# Patient Record
Sex: Female | Born: 1945 | Race: White | Hispanic: No | Smoking: Never smoker
Health system: Southern US, Community
[De-identification: ages and names within clinical notes are randomized; demographics above are authoritative.]

## PROBLEM LIST (undated history)

## (undated) DIAGNOSIS — K5090 Crohn's disease, unspecified, without complications: Secondary | ICD-10-CM

## (undated) DIAGNOSIS — K859 Acute pancreatitis without necrosis or infection, unspecified: Secondary | ICD-10-CM

## (undated) DIAGNOSIS — M1990 Unspecified osteoarthritis, unspecified site: Secondary | ICD-10-CM

## (undated) DIAGNOSIS — E119 Type 2 diabetes mellitus without complications: Secondary | ICD-10-CM

## (undated) DIAGNOSIS — I10 Essential (primary) hypertension: Secondary | ICD-10-CM

## (undated) HISTORY — DX: Type 2 diabetes mellitus without complications: E11.9

## (undated) HISTORY — PX: TYMPANOSTOMY TUBE PLACEMENT: SHX32

## (undated) HISTORY — PX: COLON SURGERY: SHX602

## (undated) HISTORY — PX: ABDOMINAL HYSTERECTOMY: SHX81

## (undated) HISTORY — PX: BACK SURGERY: SHX140

---

## 2008-02-09 ENCOUNTER — Emergency Department (HOSPITAL_COMMUNITY): Admission: EM | Admit: 2008-02-09 | Discharge: 2008-02-09 | Payer: Self-pay | Admitting: Emergency Medicine

## 2009-10-13 ENCOUNTER — Inpatient Hospital Stay (HOSPITAL_COMMUNITY): Admission: EM | Admit: 2009-10-13 | Discharge: 2009-10-19 | Payer: Self-pay | Admitting: Emergency Medicine

## 2010-05-22 LAB — CBC
HCT: 34.3 % — ABNORMAL LOW (ref 36.0–46.0)
HCT: 37.9 % (ref 36.0–46.0)
Hemoglobin: 11.1 g/dL — ABNORMAL LOW (ref 12.0–15.0)
Hemoglobin: 12.1 g/dL (ref 12.0–15.0)
Hemoglobin: 12.9 g/dL (ref 12.0–15.0)
MCH: 30.1 pg (ref 26.0–34.0)
MCHC: 34 g/dL (ref 30.0–36.0)
MCV: 92 fL (ref 78.0–100.0)
MCV: 93 fL (ref 78.0–100.0)
Platelets: 162 10*3/uL (ref 150–400)
Platelets: 163 10*3/uL (ref 150–400)
RBC: 3.69 MIL/uL — ABNORMAL LOW (ref 3.87–5.11)
RBC: 4.12 MIL/uL (ref 3.87–5.11)
WBC: 4.7 10*3/uL (ref 4.0–10.5)
WBC: 6.3 10*3/uL (ref 4.0–10.5)

## 2010-05-22 LAB — COMPREHENSIVE METABOLIC PANEL
Albumin: 4 g/dL (ref 3.5–5.2)
Alkaline Phosphatase: 67 U/L (ref 39–117)
BUN: 13 mg/dL (ref 6–23)
BUN: 3 mg/dL — ABNORMAL LOW (ref 6–23)
CO2: 19 mEq/L (ref 19–32)
Chloride: 107 mEq/L (ref 96–112)
Creatinine, Ser: 1.04 mg/dL (ref 0.4–1.2)
GFR calc non Af Amer: 53 mL/min — ABNORMAL LOW (ref >60)
Glucose, Bld: 185 mg/dL — ABNORMAL HIGH (ref 70–99)
Potassium: 3.7 mEq/L (ref 3.5–5.1)
Total Bilirubin: 1.2 mg/dL (ref 0.3–1.2)

## 2010-05-22 LAB — URINALYSIS, ROUTINE W REFLEX MICROSCOPIC
Bilirubin Urine: NEGATIVE
Glucose, UA: NEGATIVE mg/dL
Ketones, ur: NEGATIVE mg/dL
pH: 5 (ref 5.0–8.0)

## 2010-05-22 LAB — URINE MICROSCOPIC-ADD ON

## 2010-05-22 LAB — CLOSTRIDIUM DIFFICILE EIA: C difficile Toxins A+B, EIA: NEGATIVE

## 2010-05-22 LAB — GIARDIA/CRYPTOSPORIDIUM SCREEN(EIA)
Cryptosporidium Screen (EIA): NEGATIVE
Giardia Screen - EIA: NEGATIVE

## 2010-05-22 LAB — FECAL LACTOFERRIN, QUANT: Fecal Lactoferrin: POSITIVE

## 2010-05-22 LAB — DIFFERENTIAL
Basophils Absolute: 0 10*3/uL (ref 0.0–0.1)
Basophils Relative: 0 % (ref 0–1)
Basophils Relative: 0 % (ref 0–1)
Eosinophils Absolute: 0.1 10*3/uL (ref 0.0–0.7)
Eosinophils Relative: 1 % (ref 0–5)
Eosinophils Relative: 3 % (ref 0–5)
Lymphocytes Relative: 24 % (ref 12–46)
Lymphocytes Relative: 33 % (ref 12–46)
Lymphs Abs: 1.5 10*3/uL (ref 0.7–4.0)
Monocytes Relative: 7 % (ref 3–12)
Monocytes Relative: 9 % (ref 3–12)
Neutro Abs: 5.7 10*3/uL (ref 1.7–7.7)
Neutrophils Relative %: 55 % (ref 43–77)
Neutrophils Relative %: 67 % (ref 43–77)

## 2010-05-22 LAB — BASIC METABOLIC PANEL
BUN: 5 mg/dL — ABNORMAL LOW (ref 6–23)
BUN: 5 mg/dL — ABNORMAL LOW (ref 6–23)
CO2: 20 mEq/L (ref 19–32)
CO2: 22 mEq/L (ref 19–32)
CO2: 25 mEq/L (ref 19–32)
Calcium: 8.6 mg/dL (ref 8.4–10.5)
Calcium: 8.7 mg/dL (ref 8.4–10.5)
Chloride: 106 mEq/L (ref 96–112)
Creatinine, Ser: 0.84 mg/dL (ref 0.4–1.2)
GFR calc Af Amer: >60 mL/min (ref >60)
GFR calc Af Amer: >60 mL/min (ref >60)
GFR calc non Af Amer: >60 mL/min (ref >60)
Glucose, Bld: 108 mg/dL — ABNORMAL HIGH (ref 70–99)
Glucose, Bld: 139 mg/dL — ABNORMAL HIGH (ref 70–99)
Glucose, Bld: 166 mg/dL — ABNORMAL HIGH (ref 70–99)
Potassium: 3.2 mEq/L — ABNORMAL LOW (ref 3.5–5.1)
Potassium: 4.2 mEq/L (ref 3.5–5.1)
Sodium: 141 mEq/L (ref 135–145)
Sodium: 141 mEq/L (ref 135–145)
Sodium: 142 mEq/L (ref 135–145)

## 2010-05-22 LAB — STOOL CULTURE

## 2010-05-22 LAB — LIPASE, BLOOD: Lipase: 37 U/L (ref 11–59)

## 2010-12-11 LAB — URINE MICROSCOPIC-ADD ON

## 2010-12-11 LAB — URINALYSIS, ROUTINE W REFLEX MICROSCOPIC
Glucose, UA: NEGATIVE mg/dL
pH: 5.5 (ref 5.0–8.0)

## 2010-12-11 LAB — COMPREHENSIVE METABOLIC PANEL
Albumin: 4 g/dL (ref 3.5–5.2)
Alkaline Phosphatase: 69 U/L (ref 39–117)
BUN: 14 mg/dL (ref 6–23)
Calcium: 9.6 mg/dL (ref 8.4–10.5)
Potassium: 4 mEq/L (ref 3.5–5.1)
Sodium: 142 mEq/L (ref 135–145)
Total Protein: 7.5 g/dL (ref 6.0–8.3)

## 2010-12-11 LAB — CBC
HCT: 43.5 % (ref 36.0–46.0)
MCHC: 33.5 g/dL (ref 30.0–36.0)
Platelets: 317 10*3/uL (ref 150–400)
RDW: 12.9 % (ref 11.5–15.5)

## 2010-12-11 LAB — DIFFERENTIAL
Basophils Relative: 1 % (ref 0–1)
Lymphs Abs: 1.9 10*3/uL (ref 0.7–4.0)
Monocytes Absolute: 0.6 10*3/uL (ref 0.1–1.0)
Monocytes Relative: 8 % (ref 3–12)
Neutro Abs: 5.4 10*3/uL (ref 1.7–7.7)

## 2014-02-25 ENCOUNTER — Emergency Department (HOSPITAL_COMMUNITY): Payer: Medicare FFS

## 2014-02-25 ENCOUNTER — Encounter (HOSPITAL_COMMUNITY): Payer: Self-pay | Admitting: Emergency Medicine

## 2014-02-25 ENCOUNTER — Emergency Department (HOSPITAL_COMMUNITY)
Admission: EM | Admit: 2014-02-25 | Discharge: 2014-02-25 | Disposition: A | Discharge status: Home / Self Care | Payer: Medicare FFS | Attending: Emergency Medicine | Admitting: Emergency Medicine

## 2014-02-25 DIAGNOSIS — M1990 Unspecified osteoarthritis, unspecified site: Secondary | ICD-10-CM | POA: Insufficient documentation

## 2014-02-25 DIAGNOSIS — Z794 Long term (current) use of insulin: Secondary | ICD-10-CM | POA: Insufficient documentation

## 2014-02-25 DIAGNOSIS — Z791 Long term (current) use of non-steroidal anti-inflammatories (NSAID): Secondary | ICD-10-CM | POA: Diagnosis not present

## 2014-02-25 DIAGNOSIS — E119 Type 2 diabetes mellitus without complications: Secondary | ICD-10-CM | POA: Insufficient documentation

## 2014-02-25 DIAGNOSIS — S39012A Strain of muscle, fascia and tendon of lower back, initial encounter: Secondary | ICD-10-CM | POA: Diagnosis not present

## 2014-02-25 DIAGNOSIS — Z8719 Personal history of other diseases of the digestive system: Secondary | ICD-10-CM | POA: Insufficient documentation

## 2014-02-25 DIAGNOSIS — Z79899 Other long term (current) drug therapy: Secondary | ICD-10-CM | POA: Diagnosis not present

## 2014-02-25 DIAGNOSIS — W06XXXA Fall from bed, initial encounter: Secondary | ICD-10-CM | POA: Diagnosis not present

## 2014-02-25 DIAGNOSIS — Z7901 Long term (current) use of anticoagulants: Secondary | ICD-10-CM | POA: Insufficient documentation

## 2014-02-25 DIAGNOSIS — Y929 Unspecified place or not applicable: Secondary | ICD-10-CM | POA: Insufficient documentation

## 2014-02-25 DIAGNOSIS — Z880 Allergy status to penicillin: Secondary | ICD-10-CM | POA: Insufficient documentation

## 2014-02-25 DIAGNOSIS — S3992XA Unspecified injury of lower back, initial encounter: Secondary | ICD-10-CM | POA: Diagnosis present

## 2014-02-25 DIAGNOSIS — S4991XA Unspecified injury of right shoulder and upper arm, initial encounter: Secondary | ICD-10-CM | POA: Insufficient documentation

## 2014-02-25 DIAGNOSIS — Y9389 Activity, other specified: Secondary | ICD-10-CM | POA: Diagnosis not present

## 2014-02-25 DIAGNOSIS — I10 Essential (primary) hypertension: Secondary | ICD-10-CM | POA: Diagnosis not present

## 2014-02-25 DIAGNOSIS — M549 Dorsalgia, unspecified: Secondary | ICD-10-CM

## 2014-02-25 DIAGNOSIS — Y998 Other external cause status: Secondary | ICD-10-CM | POA: Insufficient documentation

## 2014-02-25 DIAGNOSIS — W19XXXA Unspecified fall, initial encounter: Secondary | ICD-10-CM

## 2014-02-25 HISTORY — DX: Unspecified osteoarthritis, unspecified site: M19.90

## 2014-02-25 HISTORY — DX: Essential (primary) hypertension: I10

## 2014-02-25 HISTORY — DX: Acute pancreatitis without necrosis or infection, unspecified: K85.90

## 2014-02-25 HISTORY — DX: Type 2 diabetes mellitus without complications: E11.9

## 2014-02-25 HISTORY — DX: Crohn's disease, unspecified, without complications: K50.90

## 2014-02-25 NOTE — ED Notes (Signed)
Fell yesterday at 0730 am.  Pt uses walker, say walker went one way and she fell towards the dresser.  Hit both knees, right arm, buttocks and right shoulder.  Rate all areas 7-8.  Back only hurts when moving.  Took 2 Norco yesterday with mild relief.

## 2014-02-25 NOTE — ED Provider Notes (Signed)
CSN: [id number]     Arrival date & time 02/25/14  1135 History   First MD Initiated Contact with Patient 02/25/14 1246     Chief Complaint  Patient presents with  . Fall     (Consider location/radiation/quality/duration/timing/severity/associated sxs/prior Treatment) Patient is a 68 y.o. female presenting with fall. The history is provided by the patient and a relative.  Fall Pertinent negatives include no chest pain, no abdominal pain, no headaches and no shortness of breath.   patient status post fall yesterday while getting out of bed. Tripped over her walker. Patient with complaint of bilateral knee pain and right shoulder pain. As well as low back pain. Fall was yesterday morning. Patient has hydrocodone at home and she took that with some relief. Patient status post multiple back surgeries many years ago. No loss of consciousness. No chest pain no shortness of breath no abdominal pain. No neck pain. No head pain.  Past Medical History  Diagnosis Date  . Hypertension   . Crohn's disease   . Diabetes mellitus without complication   . Pancreatitis   . Arthritis    Past Surgical History  Procedure Laterality Date  . Back surgery    . Abdominal hysterectomy    . Tympanostomy tube placement     History reviewed. No pertinent family history. History  Substance Use Topics  . Smoking status: Never Smoker   . Smokeless tobacco: Not on file  . Alcohol Use: No   OB History    No data available     Review of Systems  Constitutional: Negative for fever.  HENT: Negative for congestion.   Eyes: Negative for visual disturbance.  Respiratory: Negative for shortness of breath.   Cardiovascular: Negative for chest pain.  Gastrointestinal: Negative for nausea and abdominal pain.  Genitourinary: Negative for dysuria and hematuria.  Musculoskeletal: Positive for back pain. Negative for neck pain.  Skin: Negative for rash and wound.  Neurological: Negative for headaches.   Hematological: Does not bruise/bleed easily.  Psychiatric/Behavioral: Negative for confusion.      Allergies  Codeine; Contrast media; Iodine; Oxycodone; Penicillins; and Sulfa antibiotics  Home Medications   Prior to Admission medications   Medication Sig Start Date End Date Taking? Authorizing Provider  diclofenac sodium (VOLTAREN) 1 % GEL Apply 2 g topically 4 (four) times daily.   Yes Historical Provider, MD  DULoxetine (CYMBALTA) 20 MG capsule Take 20 mg by mouth daily.   Yes Historical Provider, MD  gabapentin (NEURONTIN) 300 MG capsule Take 300 mg by mouth 3 (three) times daily.   Yes Historical Provider, MD  HYDROcodone-acetaminophen (NORCO) 10-325 MG per tablet Take 1 tablet by mouth daily as needed (pain).   Yes Historical Provider, MD  insulin detemir (LEVEMIR) 100 UNIT/ML injection Inject 16 Units into the skin at bedtime.   Yes Historical Provider, MD  insulin lispro (HUMALOG KWIKPEN) 100 UNIT/ML KiwkPen Inject 1-5 Units into the skin 3 (three) times daily. Sliding scale as follows: 150-199=1 unit 200-249=2 unit 250-299=3 unit 300-349=4 unit If greater than 349 give 5 units and call MD   Yes Historical Provider, MD  mercaptopurine (PURINETHOL) 50 MG tablet Take 50 mg by mouth daily. Give on an empty stomach 1 hour before or 2 hours after meals. Caution: Chemotherapy.   Yes Historical Provider, MD  pantoprazole (PROTONIX) 40 MG tablet Take 40 mg by mouth daily.   Yes Historical Provider, MD  pravastatin (PRAVACHOL) 40 MG tablet Take 40 mg by mouth daily.   Yes Historical  Provider, MD  promethazine (PHENERGAN) 25 MG tablet Take 25 mg by mouth 2 (two) times daily as needed for nausea or vomiting.   Yes Historical Provider, MD  rivaroxaban (XARELTO) 20 MG TABS tablet Take 20 mg by mouth daily with supper.   Yes Historical Provider, MD  rOPINIRole (REQUIP) 2 MG tablet Take 4 mg by mouth at bedtime.   Yes Historical Provider, MD  TRAVATAN Z 0.004 % SOLN ophthalmic solution  Place 1 drop into both eyes at bedtime. 01/08/14  Yes Historical Provider, MD   BP 156/73 mmHg  Pulse 89  Temp(Src) 97.9 F (36.6 C) (Oral)  Ht 5' (1.524 m)  Wt 132 lb (59.875 kg)  BMI 25.78 kg/m2  SpO2 98% Physical Exam  Constitutional: She is oriented to person, place, and time. She appears well-developed and well-nourished. No distress.  HENT:  Head: Normocephalic and atraumatic.  Mouth/Throat: Oropharynx is clear and moist.  Eyes: Conjunctivae and EOM are normal. Pupils are equal, round, and reactive to light.  Neck: Normal range of motion.  Cardiovascular: Normal rate, regular rhythm and normal heart sounds.   Pulmonary/Chest: Effort normal and breath sounds normal. No respiratory distress.  Abdominal: Soft. Bowel sounds are normal. There is no tenderness.  Musculoskeletal: Normal range of motion.  Some pain to range of motion of the right shoulder. No dislocation. No tenderness to elbow no tenderness to wrist on the right radial pulses 1+. Sensation is intact. Both knees without any evidence of swelling or effusion or dislocated patella good range of motion no ligamental instability. Patient with large scars to the lumbar part of her back well healed. Some tenderness to palpation. Some bruising to the right arm.  Neurological: She is alert and oriented to person, place, and time. No cranial nerve deficit. She exhibits normal muscle tone. Coordination normal.  Skin: Skin is warm. No rash noted.  Nursing note and vitals reviewed.   ED Course  Procedures (including critical care time) Labs Review Labs Reviewed - No data to display  Imaging Review Dg Shoulder Right  02/25/2014   CLINICAL DATA:  Lost balance after getting out of bed, tried to catch herself with her RIGHT hand jamming her RIGHT shoulder, posterior RIGHT shoulder pain  EXAM: RIGHT SHOULDER - 2+ VIEW  COMPARISON:  None  FINDINGS: Osseous demineralization.  AC joint alignment normal.  No acute fracture, dislocation or  bone destruction.  Old healed fracture posterior RIGHT fifth rib.  IMPRESSION: No acute abnormalities.   Electronically Signed   By: Ulyses Southward M.D.   On: 02/25/2014 12:16   Ct Lumbar Spine Wo Contrast  02/25/2014   CLINICAL DATA:  Fall getting out of bed yesterday. Low back pain. Prior back surgeries within the past year.  EXAM: CT LUMBAR SPINE WITHOUT CONTRAST  TECHNIQUE: Multidetector CT imaging of the lumbar spine was performed without intravenous contrast administration. Multiplanar CT image reconstructions were also generated.  COMPARISON:  10/14/2009; 10/12/2009  FINDINGS: Posterolateral rod and pedicle screw fixation at L1-L2-L3-L5-S 1 on the right and at L1-L2-L3-L4-L5-S 1 on the left, with interbody cages at L5-S1 and interbody graft material at L3-4 and L4-5.  35% superior endplate compression fracture observed at L1 with lucency around the left pedicle screw, with the right pedicle screw capturing the superior cortex, but without significant bony retropulsion. The pedicle screws appear variably threaded. Posterior decompression observed from L3 through S1. There is bony demineralization with indistinct facet joints at L3-4, especially on the right or the bony facets of  L3 and L4 are not visible and the L4 spinous process is also absent. Strictly speaking I cannot exclude a underlying lytic bony lesion in this vicinity although the appearance might be explained through facetectomy. The S1 posterolateral rods attached to sacral rods which attached to screws in the iliac bones.  IVC filter noted.  Aortoiliac atherosclerotic vascular disease.  Additional findings at individual levels are as follows:  L1-2:  Borderline left foraminal stenosis due to facet arthropathy.  L2-3:  No definite impingement.  L3-4:  No definite impingement.  L4-5:  No definite impingement.  L5-S1:  No bony impingement.  IMPRESSION: 1. Extensive postoperative findings in the lumbar spine. 2. Lucency around the left L1 pedicle  screw which can be seen in the setting of loosening or infection. 3. Chronic superior endplate compression fracture at L1, 35% loss of height. 4. Highly indistinct facet joints at L3-4, especially on the right. This may be from partial facetectomy although strictly speaking a lytic process along the right facet region is not readily excluded. Correlate with operative history. 5. No definite lumbar impingement, although levels are somewhat obscured by streak artifact from the metal implants.   Electronically Signed   By: Herbie Baltimore M.D.   On: 02/25/2014 15:24   Dg Knee Complete 4 Views Left  02/25/2014   CLINICAL DATA:  Right shoulder pain, bilateral knee pain  EXAM: LEFT KNEE - COMPLETE 4+ VIEW; RIGHT KNEE - COMPLETE 4+ VIEW  COMPARISON:  None.  FINDINGS: Right knee: No fracture or dislocation. Generalized osteopenia. No joint effusion. No lytic or sclerotic osseous lesion. Normal soft tissues.  Left knee: No fracture or dislocation. Generalized osteopenia. No joint effusion. No lytic or sclerotic osseous lesion. Normal soft tissues.  IMPRESSION: No acute osseous injury of bilateral knees.   Electronically Signed   By: Elige Ko   On: 02/25/2014 12:28   Dg Knee Complete 4 Views Right  02/25/2014   CLINICAL DATA:  Right shoulder pain, bilateral knee pain  EXAM: LEFT KNEE - COMPLETE 4+ VIEW; RIGHT KNEE - COMPLETE 4+ VIEW  COMPARISON:  None.  FINDINGS: Right knee: No fracture or dislocation. Generalized osteopenia. No joint effusion. No lytic or sclerotic osseous lesion. Normal soft tissues.  Left knee: No fracture or dislocation. Generalized osteopenia. No joint effusion. No lytic or sclerotic osseous lesion. Normal soft tissues.  IMPRESSION: No acute osseous injury of bilateral knees.   Electronically Signed   By: Elige Ko   On: 02/25/2014 12:28     EKG Interpretation None      MDM   Final diagnoses:  Fall  Back pain  Shoulder injury, right, initial encounter  Lumbar strain, initial  encounter    Patient status post fall yesterday morning. Complaint of pain to both knees right shoulder. And also some low back pain. Patient's had multiple surgeries and has metal due to fusion in low part of her back. No loss of consciousness. Patient was getting out of bed and tripped over her walker. Patient denies any loss of consciousness or dizziness nausea or vomiting.  X-rays of both knees without any bony injuries. No evidence of any significant effusion or concern for ligamental injury to the knees. Right arm right wrist elbow normal. Good radial pulse. Right shoulder with some pain with range of motion but can raise her arm above her head. Possible there could be a rotator cuff strain. X-rays of the right shoulder are negative x-rays of the knees are negative. Patient also had CT  scan of her back for evaluation of that no acute injuries there. Patient stable for discharge home.   Vanetta Mulders, MD 02/25/14 [phone]

## 2014-02-25 NOTE — ED Notes (Signed)
Pt taken to room and reported just got back from xray.

## 2014-02-25 NOTE — ED Notes (Signed)
MD at bedside. 

## 2014-02-25 NOTE — ED Notes (Signed)
Pt reports fell getting out of bed yesterday. Pt reports bilateral knee pain,right arm/shoulder pain, back pain. Pt reports has had extensive back surgery last February. nad noted.pt denies any dizziness,loc.

## 2014-02-25 NOTE — Discharge Instructions (Signed)
Workup without any evidence of any bony injuries x-rays of the knees x-ray of the shoulder and is well CT scan of the back without any acute findings. Would recommend continue pain medicine follow-up with your record doctors. Return for any new or worse symptoms.

## 2014-03-12 ENCOUNTER — Encounter (INDEPENDENT_AMBULATORY_CARE_PROVIDER_SITE_OTHER): Payer: Self-pay | Admitting: *Deleted

## 2014-04-18 ENCOUNTER — Encounter (INDEPENDENT_AMBULATORY_CARE_PROVIDER_SITE_OTHER): Payer: Self-pay | Admitting: *Deleted

## 2014-04-18 ENCOUNTER — Encounter (INDEPENDENT_AMBULATORY_CARE_PROVIDER_SITE_OTHER): Payer: Self-pay | Admitting: Internal Medicine

## 2014-04-18 ENCOUNTER — Telehealth (INDEPENDENT_AMBULATORY_CARE_PROVIDER_SITE_OTHER): Payer: Self-pay | Admitting: Internal Medicine

## 2014-04-18 ENCOUNTER — Ambulatory Visit (INDEPENDENT_AMBULATORY_CARE_PROVIDER_SITE_OTHER): Payer: Medicare FFS | Admitting: Internal Medicine

## 2014-04-18 VITALS — BP 124/78 | HR 72 | Temp 97.5°F | Ht 60.0 in | Wt 140.7 lb

## 2014-04-18 DIAGNOSIS — K5090 Crohn's disease, unspecified, without complications: Secondary | ICD-10-CM

## 2014-04-18 NOTE — Telephone Encounter (Signed)
noted 

## 2014-04-18 NOTE — Progress Notes (Addendum)
Subjective:    Patient ID: Tiffany Lee, female    DOB: November 08, 1945, 69 y.o.   MRN: [id number]  HPI 69 yr old female referred by [facility] (Dr. Janann Colonel. Tiburcio Pea), [place], [place] for Crohn's disease. Dr. Aleene Davidson has retired. 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 She is taking 6-MP  daily. She lost response to Remicade and Tysabri per Dr. Vangie Bicker notes.Could not afford Humira per patient.  She tells me she is set up to have Infusion for her Crohn's [facility]). She has not started. Has had TB test in hospital last year. Will check on Hep B and C. She has a lot of nausea. She occasionally has some cramps.  She empties her a ileostomy bag about 3 times a day. Stools are brown in color. No BRRB. Appetite is okay.  No recent weight loss. Occasionally has lower abdominal cramps.  She was in Nursing Home  last year for back surgery for Rehab. She has had 4 back surgeries.Marland Kitchen Hx of spinal stenosis. 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 (recent in the past year). Pulmonary embolus after 1st back surgery in 2014 and two DVT's in 2015.  She has an IV filter. 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 Colonoscope passed thru the ileostomy to the 90cm area. More distally there was erythema and friability but no actual ulcerations beginning at the 5cm level and lasting for about 10cm. Multiple biopsies taken. More proximally the ileal mucosa was normal.  Finding: Probable distal ileal Crohn's.   12/26/2013 H and H 13.4 and 41.4, Platelet ct 481, ALP 109, ALT 15, AST 17, Direct bilirubin 0.1, Indirect bili 0.6., total bili 0.7  9/16.2015 TPMT test: Normal activity  Review of Systems Past Medical History  Diagnosis Date  . Hypertension   . Crohn's disease   . Diabetes mellitus without complication   . Pancreatitis   . Arthritis   . Diabetes   . Hypertension     Past Surgical History  Procedure Laterality Date  . Back  surgery    . Abdominal hysterectomy    . Tympanostomy tube placement    . Colon surgery      ileostomy in 1994    Allergies  Allergen Reactions  . Codeine Itching    Hives,itching  . Contrast Media [Iodinated Diagnostic Agents]     Hives   . Coumadin [Warfarin Sodium]   . Iodine Itching  . Oxycodone     hallucinations  . Penicillins Hives    Rash as a child. Has had ceftriaxone without difficulty subsequently.   . Sulfa Antibiotics     Hives     Current Outpatient Prescriptions on File Prior to Visit  Medication Sig Dispense Refill  . diclofenac sodium (VOLTAREN) 1 % GEL Apply 2 g topically 4 (four) times daily.    . DULoxetine (CYMBALTA) 20 MG capsule Take 20 mg by mouth daily.    Marland Kitchen gabapentin (NEURONTIN) 300 MG capsule Take 300 mg by mouth 3 (three) times daily.    Marland Kitchen HYDROcodone-acetaminophen (NORCO) 10-325 MG per tablet Take 1 tablet by mouth daily as needed (pain).    . insulin detemir (LEVEMIR) 100 UNIT/ML injection Inject 16 Units into the skin at bedtime.    . insulin lispro (HUMALOG KWIKPEN) 100 UNIT/ML KiwkPen Inject 1-5 Units into the skin 3 (three) times daily. Sliding scale as follows: 150-199=1 unit 200-249=2 unit 250-299=3 unit 300-349=4 unit If greater than 349 give 5 units  and call MD    . mercaptopurine (PURINETHOL) 50 MG tablet Take 50 mg by mouth daily. Give on an empty stomach 1 hour before or 2 hours after meals. Caution: Chemotherapy.    . pravastatin (PRAVACHOL) 40 MG tablet Take 40 mg by mouth daily.    . promethazine (PHENERGAN) 25 MG tablet Take 25 mg by mouth 2 (two) times daily as needed for nausea or vomiting.    . rivaroxaban (XARELTO) 20 MG TABS tablet Take 20 mg by mouth daily with supper.    Marland Kitchen rOPINIRole (REQUIP) 2 MG tablet Take 4 mg by mouth at bedtime.    . TRAVATAN Z 0.004 % SOLN ophthalmic solution Place 1 drop into both eyes at bedtime.     No current facility-administered medications on file prior to visit.        Objective:    Physical Exam   Filed Vitals:   04/18/14 1110  Height: 5' (1.524 m)  Weight: 140 lb 11.2 oz (63.821 kg)   Alert and oriented. Skin warm and dry. Oral mucosa is moist.   . Sclera anicteric, conjunctivae is pink. Thyroid not enlarged. No cervical lymphadenopathy. Lungs clear. Heart regular rate and rhythm.  Abdomen is soft. Bowel sounds are positive. No hepatomegaly. No abdominal masses felt. No tenderness.  No edema to lower extremities.          Assessment & Plan:  Small bowel Crohn's disease.  She has an ileostomy in place.  Will get a CBC, CRP, and CMET today.  Keep appt with infusion clinic in [place].  Continue the .  Will call Dr. Vangie Bicker office and get results of TB test, Hep B and C an TPMT level. I have asked Lupita Leash to call office for these.

## 2014-04-18 NOTE — Patient Instructions (Signed)
OV in 6 months. Continue . Keep appt with [facility] for IV infusion in [place], [place].

## 2014-04-19 LAB — COMPREHENSIVE METABOLIC PANEL
ALT: 21 U/L (ref 0–35)
AST: 19 U/L (ref 0–37)
Albumin: 4.1 g/dL (ref 3.5–5.2)
Alkaline Phosphatase: 122 U/L — ABNORMAL HIGH (ref 39–117)
BUN: 18 mg/dL (ref 6–23)
CALCIUM: 9.7 mg/dL (ref 8.4–10.5)
CHLORIDE: 109 meq/L (ref 96–112)
CO2: 24 meq/L (ref 19–32)
CREATININE: 0.96 mg/dL (ref 0.50–1.10)
GLUCOSE: 162 mg/dL — AB (ref 70–99)
Potassium: 4.8 mEq/L (ref 3.5–5.3)
Sodium: 142 mEq/L (ref 135–145)
TOTAL PROTEIN: 7.4 g/dL (ref 6.0–8.3)
Total Bilirubin: 0.8 mg/dL (ref 0.2–1.2)

## 2014-04-19 LAB — CBC WITH DIFFERENTIAL/PLATELET
Basophils Absolute: 0.1 10*3/uL (ref 0.0–0.1)
Basophils Relative: 1 % (ref 0–1)
Eosinophils Absolute: 0.1 10*3/uL (ref 0.0–0.7)
Eosinophils Relative: 2 % (ref 0–5)
HEMATOCRIT: 41.7 % (ref 36.0–46.0)
Hemoglobin: 13.6 g/dL (ref 12.0–15.0)
LYMPHS PCT: 24 % (ref 12–46)
Lymphs Abs: 1.3 10*3/uL (ref 0.7–4.0)
MCH: 29.3 pg (ref 26.0–34.0)
MCHC: 32.6 g/dL (ref 30.0–36.0)
MCV: 89.9 fL (ref 78.0–100.0)
MONO ABS: 0.6 10*3/uL (ref 0.1–1.0)
MONOS PCT: 10 % (ref 3–12)
MPV: 9.5 fL (ref 8.6–12.4)
NEUTROS ABS: 3.5 10*3/uL (ref 1.7–7.7)
NEUTROS PCT: 63 % (ref 43–77)
PLATELETS: 283 10*3/uL (ref 150–400)
RBC: 4.64 MIL/uL (ref 3.87–5.11)
RDW: 16.1 % — AB (ref 11.5–15.5)
WBC: 5.5 10*3/uL (ref 4.0–10.5)

## 2014-04-19 LAB — C-REACTIVE PROTEIN: CRP: <0.5 mg/dL (ref <0.60)

## 2014-04-24 ENCOUNTER — Encounter (INDEPENDENT_AMBULATORY_CARE_PROVIDER_SITE_OTHER): Payer: Self-pay | Admitting: Internal Medicine

## 2014-04-26 ENCOUNTER — Encounter (INDEPENDENT_AMBULATORY_CARE_PROVIDER_SITE_OTHER): Payer: Self-pay

## 2014-05-07 ENCOUNTER — Telehealth (INDEPENDENT_AMBULATORY_CARE_PROVIDER_SITE_OTHER): Payer: Self-pay | Admitting: Internal Medicine

## 2014-05-07 NOTE — Telephone Encounter (Signed)
Hep B surface antigen and Hep C antibody negative per records sent

## 2014-05-14 ENCOUNTER — Telehealth (INDEPENDENT_AMBULATORY_CARE_PROVIDER_SITE_OTHER): Payer: Self-pay | Admitting: Internal Medicine

## 2014-05-14 NOTE — Telephone Encounter (Signed)
PPD is negative. 05/02/2014

## 2014-05-28 ENCOUNTER — Encounter (INDEPENDENT_AMBULATORY_CARE_PROVIDER_SITE_OTHER): Payer: Self-pay

## 2014-06-06 ENCOUNTER — Telehealth (INDEPENDENT_AMBULATORY_CARE_PROVIDER_SITE_OTHER): Payer: Self-pay | Admitting: *Deleted

## 2014-06-06 NOTE — Telephone Encounter (Signed)
We rec'd from a Dr.Harris in [place]. A denial for Mercaptopurine. Reason on the paper was they are not going to do it because Dr.Spainhour was no longer there.Note, he has retired. We have ask for a 72 hour (expedite) decision. As the patient has Crohn's disease and uses the 6 MP along with the En[facility] (just started) Infusuions, to maintain disease.  A call was made to [phone]/[place]. It was explained we were her new GI and that the Internist would not do because GI had left. The reference # for this case is [id number] Fax # [phone] for the records we have ,have been sent.

## 2014-06-11 ENCOUNTER — Ambulatory Visit (INDEPENDENT_AMBULATORY_CARE_PROVIDER_SITE_OTHER): Payer: Medicare FFS | Admitting: Internal Medicine

## 2014-06-18 ENCOUNTER — Telehealth (INDEPENDENT_AMBULATORY_CARE_PROVIDER_SITE_OTHER): Payer: Self-pay | Admitting: *Deleted

## 2014-06-18 NOTE — Telephone Encounter (Signed)
Steward Drone from [place] called to make Tiffany Lee know that this patient has decided to stop the Entyvio Infusions. If [name] has any questions,Brenda ask that she call her at  [phone].

## 2014-06-20 NOTE — Telephone Encounter (Signed)
noted 

## 2014-07-04 ENCOUNTER — Encounter (INDEPENDENT_AMBULATORY_CARE_PROVIDER_SITE_OTHER): Payer: Self-pay

## 2014-07-05 ENCOUNTER — Encounter (INDEPENDENT_AMBULATORY_CARE_PROVIDER_SITE_OTHER): Payer: Self-pay

## 2014-08-07 ENCOUNTER — Encounter (INDEPENDENT_AMBULATORY_CARE_PROVIDER_SITE_OTHER): Payer: Self-pay | Admitting: *Deleted

## 2014-10-17 ENCOUNTER — Ambulatory Visit (INDEPENDENT_AMBULATORY_CARE_PROVIDER_SITE_OTHER): Payer: Medicare FFS | Admitting: Internal Medicine

## 2020-10-18 ENCOUNTER — Emergency Department (HOSPITAL_COMMUNITY)
Admission: EM | Admit: 2020-10-18 | Discharge: 2020-10-19 | Disposition: A | Discharge status: Home / Self Care | Payer: Medicare HMO | Attending: Emergency Medicine | Admitting: Emergency Medicine

## 2020-10-18 ENCOUNTER — Other Ambulatory Visit: Payer: Self-pay

## 2020-10-18 DIAGNOSIS — Y92002 Bathroom of unspecified non-institutional (private) residence single-family (private) house as the place of occurrence of the external cause: Secondary | ICD-10-CM | POA: Insufficient documentation

## 2020-10-18 DIAGNOSIS — M25512 Pain in left shoulder: Secondary | ICD-10-CM | POA: Insufficient documentation

## 2020-10-18 DIAGNOSIS — S29019A Strain of muscle and tendon of unspecified wall of thorax, initial encounter: Secondary | ICD-10-CM

## 2020-10-18 DIAGNOSIS — S39012A Strain of muscle, fascia and tendon of lower back, initial encounter: Secondary | ICD-10-CM

## 2020-10-18 DIAGNOSIS — W19XXXA Unspecified fall, initial encounter: Secondary | ICD-10-CM | POA: Diagnosis not present

## 2020-10-18 DIAGNOSIS — E119 Type 2 diabetes mellitus without complications: Secondary | ICD-10-CM | POA: Diagnosis not present

## 2020-10-18 DIAGNOSIS — S8002XA Contusion of left knee, initial encounter: Secondary | ICD-10-CM | POA: Diagnosis not present

## 2020-10-18 DIAGNOSIS — M545 Low back pain, unspecified: Secondary | ICD-10-CM | POA: Insufficient documentation

## 2020-10-18 DIAGNOSIS — S40012A Contusion of left shoulder, initial encounter: Secondary | ICD-10-CM

## 2020-10-18 DIAGNOSIS — M546 Pain in thoracic spine: Secondary | ICD-10-CM | POA: Diagnosis not present

## 2020-10-18 DIAGNOSIS — I10 Essential (primary) hypertension: Secondary | ICD-10-CM | POA: Insufficient documentation

## 2020-10-18 DIAGNOSIS — Z79899 Other long term (current) drug therapy: Secondary | ICD-10-CM | POA: Insufficient documentation

## 2020-10-18 DIAGNOSIS — Z794 Long term (current) use of insulin: Secondary | ICD-10-CM | POA: Diagnosis not present

## 2020-10-18 NOTE — ED Triage Notes (Signed)
BIB [facility], c/o a fall at home. Pt states she tripped while in the bathroom, pt states she laid there approx 5 hours before she was able to get in touch with someone. Denies LOC. Not on blood thinners. Pt is complaining of severe pain that starts from her left shoulder all the way down.

## 2020-10-19 ENCOUNTER — Emergency Department (HOSPITAL_COMMUNITY): Payer: Medicare HMO

## 2020-10-19 LAB — BASIC METABOLIC PANEL
Anion gap: 8 (ref 5–15)
BUN: 28 mg/dL — ABNORMAL HIGH (ref 8–23)
CO2: 24 mmol/L (ref 22–32)
Calcium: 9.5 mg/dL (ref 8.9–10.3)
Chloride: 108 mmol/L (ref 98–111)
Creatinine, Ser: 1.07 mg/dL — ABNORMAL HIGH (ref 0.44–1.00)
GFR, Estimated: 53 mL/min — ABNORMAL LOW (ref >60)
Glucose, Bld: 146 mg/dL — ABNORMAL HIGH (ref 70–99)
Potassium: 4.4 mmol/L (ref 3.5–5.1)
Sodium: 140 mmol/L (ref 135–145)

## 2020-10-19 LAB — CBC WITH DIFFERENTIAL/PLATELET
Abs Immature Granulocytes: 0.05 10*3/uL (ref 0.00–0.07)
Basophils Absolute: 0.1 10*3/uL (ref 0.0–0.1)
Basophils Relative: 1 %
Eosinophils Absolute: 0.1 10*3/uL (ref 0.0–0.5)
Eosinophils Relative: 1 %
HCT: 37.2 % (ref 36.0–46.0)
Hemoglobin: 12 g/dL (ref 12.0–15.0)
Immature Granulocytes: 1 %
Lymphocytes Relative: 10 %
Lymphs Abs: 1.1 10*3/uL (ref 0.7–4.0)
MCH: 30.5 pg (ref 26.0–34.0)
MCHC: 32.3 g/dL (ref 30.0–36.0)
MCV: 94.4 fL (ref 80.0–100.0)
Monocytes Absolute: 1.1 10*3/uL — ABNORMAL HIGH (ref 0.1–1.0)
Monocytes Relative: 10 %
Neutro Abs: 8.7 10*3/uL — ABNORMAL HIGH (ref 1.7–7.7)
Neutrophils Relative %: 77 %
Platelets: 220 10*3/uL (ref 150–400)
RBC: 3.94 MIL/uL (ref 3.87–5.11)
RDW: 14.5 % (ref 11.5–15.5)
WBC: 11.1 10*3/uL — ABNORMAL HIGH (ref 4.0–10.5)
nRBC: 0 % (ref 0.0–0.2)

## 2020-10-19 LAB — CK: Total CK: 281 U/L — ABNORMAL HIGH (ref 38–234)

## 2020-10-19 MED ORDER — HYDROCODONE-ACETAMINOPHEN 5-325 MG PO TABS: 1.0000 | ORAL_TABLET | Freq: Four times a day (QID) | ORAL | 0 refills | Status: AC | PRN

## 2020-10-19 MED ORDER — MORPHINE SULFATE (PF) 4 MG/ML IV SOLN
4.0000 mg | Freq: Once | INTRAVENOUS | Status: AC
  Administered 2020-10-19: 4 mg via INTRAVENOUS
  Filled 2020-10-19: qty 1

## 2020-10-19 MED ORDER — ONDANSETRON HCL 4 MG/2ML IJ SOLN
4.0000 mg | Freq: Once | INTRAMUSCULAR | Status: AC
  Administered 2020-10-19: 4 mg via INTRAVENOUS
  Filled 2020-10-19: qty 2

## 2020-10-19 MED ORDER — HEPARIN SOD (PORK) LOCK FLUSH 100 UNIT/ML IV SOLN
500.0000 [IU] | Freq: Once | INTRAVENOUS | Status: AC
  Administered 2020-10-19: 500 [IU]
  Filled 2020-10-19: qty 5

## 2020-10-19 NOTE — ED Notes (Signed)
Pt family given update.

## 2020-10-19 NOTE — ED Provider Notes (Signed)
[facility] EMERGENCY DEPARTMENT Provider Note   CSN: [id number] Arrival date & time: 10/18/20  2314     History Chief Complaint  Patient presents with   Tiffany Lee is a 75 y.o. female.  Patient is a 75 year old female with past medical history of diabetes, hypertension, Crohn's disease, arthritis, and multiple prior back surgeries.  Patient presenting today for evaluation of fall.  She was in the bathroom attempting to get off of the toilet when she lost her balance and fell landing on her left side.  She was unable to ambulate afterward and laid on the floor for 4 to 5 hours before she could slide on her back to the telephone and call for help.  She describes pain throughout her entire back, left shoulder, and left knee.  She denies loss of consciousness or neck pain.  The history is provided by the patient.      Past Medical History:  Diagnosis Date   Arthritis    Crohn's disease (HCC)    Diabetes (HCC)    Diabetes mellitus without complication (HCC)    Hypertension    Hypertension    Pancreatitis     There are no problems to display for this patient.   Past Surgical History:  Procedure Laterality Date   ABDOMINAL HYSTERECTOMY     BACK SURGERY     COLON SURGERY     ileostomy in 1994   TYMPANOSTOMY TUBE PLACEMENT       OB History   No obstetric history on file.     No family history on file.  Social History   Tobacco Use   Smoking status: Never  Substance Use Topics   Alcohol use: No   Drug use: No    Home Medications Prior to Admission medications   Medication Sig Start Date End Date Taking? Authorizing Provider  diclofenac (FLECTOR) 1.3 % PTCH Place 1 patch onto the skin 2 (two) times daily.    [provider]  diclofenac sodium (VOLTAREN) 1 % GEL Apply 2 g topically 4 (four) times daily.    [provider]  DULoxetine (CYMBALTA) 20 MG capsule Take 20 mg by mouth daily.    [provider]  gabapentin  (NEURONTIN) 300 MG capsule Take 300 mg by mouth 3 (three) times daily.    [provider]  HYDROcodone-acetaminophen (NORCO) 10-325 MG per tablet Take 1 tablet by mouth daily as needed (pain).    [provider]  insulin detemir (LEVEMIR) 100 UNIT/ML injection Inject 16 Units into the skin at bedtime.    [provider]  insulin lispro (HUMALOG KWIKPEN) 100 UNIT/ML KiwkPen Inject 1-5 Units into the skin 3 (three) times daily. Sliding scale as follows: 150-199=1 unit 200-249=2 unit 250-299=3 unit 300-349=4 unit If greater than 349 give 5 units and call MD    [provider]  mercaptopurine (PURINETHOL) 50 MG tablet Take 50 mg by mouth daily. Give on an empty stomach 1 hour before or 2 hours after meals. Caution: Chemotherapy.    [provider]  metoprolol succinate (TOPROL-XL) 25 MG 24 hr tablet Take 25 mg by mouth daily.    [provider]  pravastatin (PRAVACHOL) 40 MG tablet Take 40 mg by mouth daily.    [provider]  promethazine (PHENERGAN) 25 MG tablet Take 25 mg by mouth 2 (two) times daily as needed for nausea or vomiting.    [provider]  rivaroxaban (XARELTO) 20 MG TABS tablet  Take 20 mg by mouth daily with supper.    [provider]  rOPINIRole (REQUIP) 2 MG tablet Take 4 mg by mouth at bedtime.    [provider]  TRAVATAN Z 0.004 % SOLN ophthalmic solution Place 1 drop into both eyes at bedtime. 01/08/14   [provider]    Allergies    Codeine, Contrast media [iodinated diagnostic agents], Coumadin [warfarin sodium], Iodine, Oxycodone, Penicillins, and Sulfa antibiotics  Review of Systems   Review of Systems  All other systems reviewed and are negative.  Physical Exam Updated Vital Signs BP (!) 142/83   Pulse 99   Temp 97.8 F (36.6 C) (Oral)   Resp (!) 22   Ht 5' (1.524 m)   Wt 68 kg   SpO2 100%   BMI 29.29 kg/m   Physical Exam Vitals and nursing note  reviewed.  Constitutional:      General: She is not in acute distress.    Appearance: She is well-developed. She is not diaphoretic.  HENT:     Head: Normocephalic and atraumatic.  Cardiovascular:     Rate and Rhythm: Normal rate and regular rhythm.     Heart sounds: No murmur heard.   No friction rub. No gallop.  Pulmonary:     Effort: Pulmonary effort is normal. No respiratory distress.     Breath sounds: Normal breath sounds. No wheezing.  Abdominal:     General: Bowel sounds are normal. There is no distension.     Palpations: Abdomen is soft.     Tenderness: There is no abdominal tenderness.  Musculoskeletal:        General: Normal range of motion.     Cervical back: Normal range of motion and neck supple.     Comments: There is a slight contusion/ecchymosis overlying the left patella.  There is tenderness in this area.  There is no effusion.  Patient has pain with range of motion and knee exam limited secondary to this.  The left shoulder appears grossly normal.  She is able to move the shoulder, however has tenderness when palpated over the [facility] joint.  Ulnar and radial pulses are intact and patient is able to flex, extend, and oppose all fingers.  Sensation is intact throughout the entire hand.  There is tenderness to palpation throughout the entire thoracic and lumbar spine.  There is no step-off.  Skin:    General: Skin is warm and dry.  Neurological:     General: No focal deficit present.     Mental Status: She is alert and oriented to person, place, and time.    ED Results / Procedures / Treatments   Labs (all labs ordered are listed, but only abnormal results are displayed) Labs Reviewed - No data to display  EKG None  Radiology No results found.  Procedures Procedures   Medications Ordered in ED Medications  ondansetron (ZOFRAN) injection 4 mg (has no administration in time range)  morphine 4 MG/ML injection 4 mg (has no administration in time range)     ED Course  I have reviewed the triage vital signs and the nursing notes.  Pertinent labs & imaging results that were available during my care of the patient were reviewed by me and considered in my medical decision making (see chart for details).    MDM Rules/Calculators/A&P  Patient presenting after a fall, the events of which are described in the HPI.  She is having pain in her left shoulder, left knee, and  entire back.  Patient's x-rays are all unremarkable.  She has been hemodynamically stable throughout her ED course.  I highly doubt any life-threatening or serious injuries and feel as though patient can safely be discharged.  She will be given medicine for pain and advised to follow-up with primary doctor.  Final Clinical Impression(s) / ED Diagnoses Final diagnoses:  None    Rx / DC Orders ED Discharge Orders     None        Geoffery Lyons, MD 10/19/20 3664

## 2020-10-19 NOTE — Discharge Instructions (Addendum)
Apply ice to affected areas for 20 minutes every 2 hours while awake for the next 2 days as needed for swelling.  Rest.  Take hydrocodone as prescribed as needed for pain.  Follow-up with primary doctor if symptoms are not improving in the next week.

## 2020-11-19 ENCOUNTER — Ambulatory Visit (HOSPITAL_COMMUNITY): Payer: Medicare HMO | Attending: Infectious Diseases | Admitting: Physical Therapy

## 2020-11-19 ENCOUNTER — Other Ambulatory Visit: Payer: Self-pay

## 2020-11-19 ENCOUNTER — Encounter (HOSPITAL_COMMUNITY): Payer: Self-pay | Admitting: Physical Therapy

## 2020-11-19 DIAGNOSIS — R293 Abnormal posture: Secondary | ICD-10-CM | POA: Diagnosis present

## 2020-11-19 DIAGNOSIS — M542 Cervicalgia: Secondary | ICD-10-CM | POA: Diagnosis not present

## 2020-11-19 NOTE — Therapy (Addendum)
[facility] [address] [place], [place], [address] Phone: [phone]   Fax:  [phone]  Physical Therapy Evaluation  Patient Details  Name: Tiffany Lee MRN: [id number] Date of Birth: [birth year] Referring Provider (PT): Acey Lav MD   Encounter Date: 11/19/2020   PT End of Session - 11/19/20 1722     Visit Number 1    Number of Visits 16    Date for PT Re-Evaluation 01/14/21    Authorization Type [employer]    Authorization Time Period Check auth    PT Start Time 1640    PT Stop Time 1723    PT Time Calculation (min) 43 min    Activity Tolerance Patient tolerated treatment well    Behavior During Therapy [facility] for tasks assessed/performed             Past Medical History:  Diagnosis Date   Arthritis    Crohn's disease (HCC)    Diabetes (HCC)    Diabetes mellitus without complication (HCC)    Hypertension    Hypertension    Pancreatitis     Past Surgical History:  Procedure Laterality Date   ABDOMINAL HYSTERECTOMY     BACK SURGERY     COLON SURGERY     ileostomy in 1994   TYMPANOSTOMY TUBE PLACEMENT      There were no vitals filed for this visit.    Subjective Assessment - 11/19/20 1641     Subjective Patient presents to therapy with complaint of neck pain. She reports extensive history of back pain and spine surgery. 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 She reports neck has been very stiff lately and has been bothering her. Currently taking pain medication and Tylenol.    Pertinent History Extensive surgical history, fused from L spine to C spine    Limitations Sitting;House hold activities    Patient Stated Goals To look up, and to the sides and not hurt    Currently in Pain? Yes    Pain Score 8     Pain Location Neck    Pain Orientation Posterior    Pain Descriptors / Indicators Aching;Constant    Pain Type Chronic pain    Pain Onset More than a month ago    Pain  Frequency Constant    Aggravating Factors  looking up, turning head    Pain Relieving Factors holding still, meds    Effect of Pain on Daily Activities Limits                [facility] PT Assessment - 11/19/20 0001       Assessment   Medical Diagnosis Neck pain    Referring Provider (PT) Acey Lav MD    Prior Therapy Home health      Precautions   Precautions Fall;Other (comment)   Oxygen     Restrictions   Weight Bearing Restrictions No      Balance Screen   Has the patient fallen in the past 6 months Yes    How many times? 1    Has the patient had a decrease in activity level because of a fear of falling?  Yes    Is the patient reluctant to leave their home because of a fear of falling?  Yes      Home Environment   Living Environment Private residence    Living Arrangements Children    Available Help at Discharge Family  Prior Function   Level of Independence Needs assistance with ADLs      Cognition   Overall Cognitive Status Within Functional Limits for tasks assessed      Observation/Other Assessments   Focus on Therapeutic Outcomes (FOTO)  35% function      Posture/Postural Control   Posture/Postural Control Postural limitations    Postural Limitations Rounded Shoulders;Forward head      ROM / Strength   AROM / PROM / Strength AROM;Strength      AROM   Overall AROM Comments Mod restriction in LT shoulder elevation    AROM Assessment Site Cervical    Cervical Flexion 30    Cervical Extension 24    Cervical - Right Rotation 44    Cervical - Left Rotation 45      Palpation   Palpation comment Mod/ max TTP about bilateral upper trapezius, cervical paraspinals                        Objective measurements completed on examination: See above findings.       [facility] Adult PT Treatment/Exercise - 11/19/20 0001       Exercises   Exercises Shoulder      Shoulder Exercises: Seated   Other Seated Exercises shoulder rolls x 10,  scap retraction x 10, cervical rotation and flexion/ extension (pain free ROM) x 10 each                     PT Education - 11/19/20 1643     Education Details on evaluation findings, POC and HEP    Person(s) Educated Patient;Other (comment)    Methods Explanation;Handout    Comprehension Verbalized understanding              PT Short Term Goals - 11/19/20 1729       PT SHORT TERM GOAL #1   Title Patient will be independent with initial HEP and self-management strategies to improve functional outcomes    Time 4    Period Weeks    Status New    Target Date 12/17/20      PT SHORT TERM GOAL #2   Title Patient will report at least 30% overall improvement in subjective complaint to indicate improvement in ability to perform ADLs.    Time 4    Period Weeks    Status New    Target Date 12/17/20               PT Long Term Goals - 11/19/20 1730       PT LONG TERM GOAL #1   Title Patient will report at least 60% overall improvement in subjective complaint to indicate improvement in ability to perform ADLs.    Time 8    Period Weeks    Status New    Target Date 01/14/21      PT LONG TERM GOAL #2   Title Patient improve bilateral cervical rotation by 5 degrees in order to improve ability to scan environment for safety and while driving.    Time 8    Period Weeks    Status New    Target Date 01/14/21      PT LONG TERM GOAL #3   Title Patient will improve FOTO score to predicted value to indicate improvement in functional outcomes    Time 8    Period Weeks    Status New    Target Date 01/14/21  Plan - 11/19/20 1724     Clinical Impression Statement Patient is a 75 y.o. Female who presents to physical therapy with complaint of neck pain. Patient demonstrates ROM restriction, reduced flexibility, increased tenderness to palpation and postural abnormalities which are likely contributing to symptoms of pain and are negatively  impacting patient ability to perform ADLs. Patient will benefit from skilled physical therapy services to address these deficits to reduce pain and improve level of function with ADLs    Personal Factors and Comorbidities Age;Comorbidity 3+    Examination-Activity Limitations Transfers;Bathing;Lift;Sit;Dressing;Hygiene/Grooming    Examination-Participation Restrictions Cleaning;Meal Prep;Laundry    Stability/Clinical Decision Making Stable/Uncomplicated    Clinical Decision Making Low    Rehab Potential Good    PT Frequency 2x / week    PT Duration 8 weeks    PT Treatment/Interventions ADLs/Self Care Home Management;Biofeedback;Traction;Moist Heat;Iontophoresis 4mg /ml Dexamethasone;DME Instruction;Balance training;Gait training;Neuromuscular re-education;Scar mobilization;Passive range of motion;Vestibular;Visual/perceptual remediation/compensation;Dry needling;Spinal Manipulations;Energy conservation;Manual techniques;Functional mobility training;Stair training;Patient/family education;Therapeutic activities;Parrafin;Ultrasound;Canalith Repostioning;Cryotherapy;Electrical Stimulation;Contrast Bath;Fluidtherapy;Therapeutic exercise;Orthotic Fit/Training;Compression bandaging;Manual lymph drainage;Splinting;Taping;Joint Manipulations;Vasopneumatic Device    PT Next Visit Plan Review HEP and goals. Progress postural strength and cervical mobility as tolerated. Chin tuck, manual STM. Thoracic rotation. Band work when ready    PT Home Exercise Plan Eval: shoulder roll, scap retraction, cervical AROM rotation and flexion/ extension    Consulted and Agree with Plan of Care Family member/caregiver    Family Member Consulted Daughter in law             Patient will benefit from skilled therapeutic intervention in order to improve the following deficits and impairments:  Pain, Improper body mechanics, Increased fascial restricitons, Impaired flexibility, Impaired UE functional use, Decreased strength,  Hypomobility, Decreased range of motion, Decreased activity tolerance, Decreased mobility, Postural dysfunction  Visit Diagnosis: Cervicalgia  Abnormal posture     Problem List There are no problems to display for this patient.  5:34 PM, 11/19/20 11/21/20 PT DPT  Physical Therapist with [facility]  [facility]  [phone]  [facility] [address] [place], [place], [place] Phone: [phone]   Fax:  [phone]  Name: Tiffany Lee MRN: Yates Decamp Date of Birth: January 29, 1946

## 2020-11-19 NOTE — Patient Instructions (Signed)
Access Code: [email or web address] URL: [email or web address] Date: 11/19/2020 Prepared by: Georges Lynch  Exercises Seated Shoulder Rolls - 3 x daily - 7 x weekly - 2 sets - 10 reps Seated Scapular Retraction - 3 x daily - 7 x weekly - 2 sets - 10 reps - 3 second hold Seated Cervical Rotation AROM - 3 x daily - 7 x weekly - 2 sets - 10 reps Seated Cervical Extension AROM - 3 x daily - 7 x weekly - 2 sets - 10 reps

## 2020-11-24 NOTE — Addendum Note (Signed)
Addended by: Georges Lynch A on: 11/24/2020 08:20 AM   Modules accepted: Orders

## 2020-11-25 ENCOUNTER — Telehealth (HOSPITAL_COMMUNITY): Payer: Self-pay

## 2020-11-25 ENCOUNTER — Ambulatory Visit (HOSPITAL_COMMUNITY): Payer: Medicare HMO

## 2020-11-25 NOTE — Telephone Encounter (Signed)
She had caregiver call to cx she is not feeling well today

## 2020-11-27 ENCOUNTER — Encounter (HOSPITAL_COMMUNITY): Payer: Medicare HMO | Admitting: Physical Therapy

## 2020-12-01 ENCOUNTER — Ambulatory Visit (HOSPITAL_COMMUNITY): Payer: Medicare HMO | Admitting: Physical Therapy

## 2020-12-04 ENCOUNTER — Encounter (HOSPITAL_COMMUNITY): Payer: Medicare HMO | Admitting: Physical Therapy

## 2020-12-09 ENCOUNTER — Encounter (HOSPITAL_COMMUNITY): Payer: Medicare HMO | Admitting: Physical Therapy

## 2020-12-11 ENCOUNTER — Encounter (HOSPITAL_COMMUNITY): Payer: Medicare HMO

## 2020-12-16 ENCOUNTER — Encounter (HOSPITAL_COMMUNITY): Payer: Medicare HMO | Admitting: Physical Therapy

## 2020-12-18 ENCOUNTER — Encounter (HOSPITAL_COMMUNITY): Payer: Medicare HMO

## 2020-12-23 ENCOUNTER — Encounter (HOSPITAL_COMMUNITY): Payer: Medicare HMO | Admitting: Physical Therapy

## 2020-12-25 ENCOUNTER — Encounter (HOSPITAL_COMMUNITY): Payer: Medicare HMO

## 2020-12-30 ENCOUNTER — Encounter (HOSPITAL_COMMUNITY): Payer: Medicare HMO

## 2021-01-01 ENCOUNTER — Encounter (HOSPITAL_COMMUNITY): Payer: Medicare HMO

## 2021-01-06 ENCOUNTER — Encounter (HOSPITAL_COMMUNITY): Payer: Medicare HMO | Admitting: Physical Therapy

## 2021-01-08 ENCOUNTER — Encounter (HOSPITAL_COMMUNITY): Payer: Medicare HMO | Admitting: Physical Therapy

## 2021-01-13 ENCOUNTER — Encounter (HOSPITAL_COMMUNITY): Payer: Medicare HMO | Admitting: Physical Therapy

## 2021-01-15 ENCOUNTER — Encounter (HOSPITAL_COMMUNITY): Payer: Medicare HMO | Admitting: Physical Therapy

## 2021-04-08 DEATH — deceased

## 2021-12-19 IMAGING — DX DG LUMBAR SPINE COMPLETE 4+V
5 series · 5 of 5 positions shown · non-contrast
Comparison: None.

CLINICAL DATA: Fall, pain

EXAM:
LUMBAR SPINE - COMPLETE 4+ VIEW

[l-spine ap]
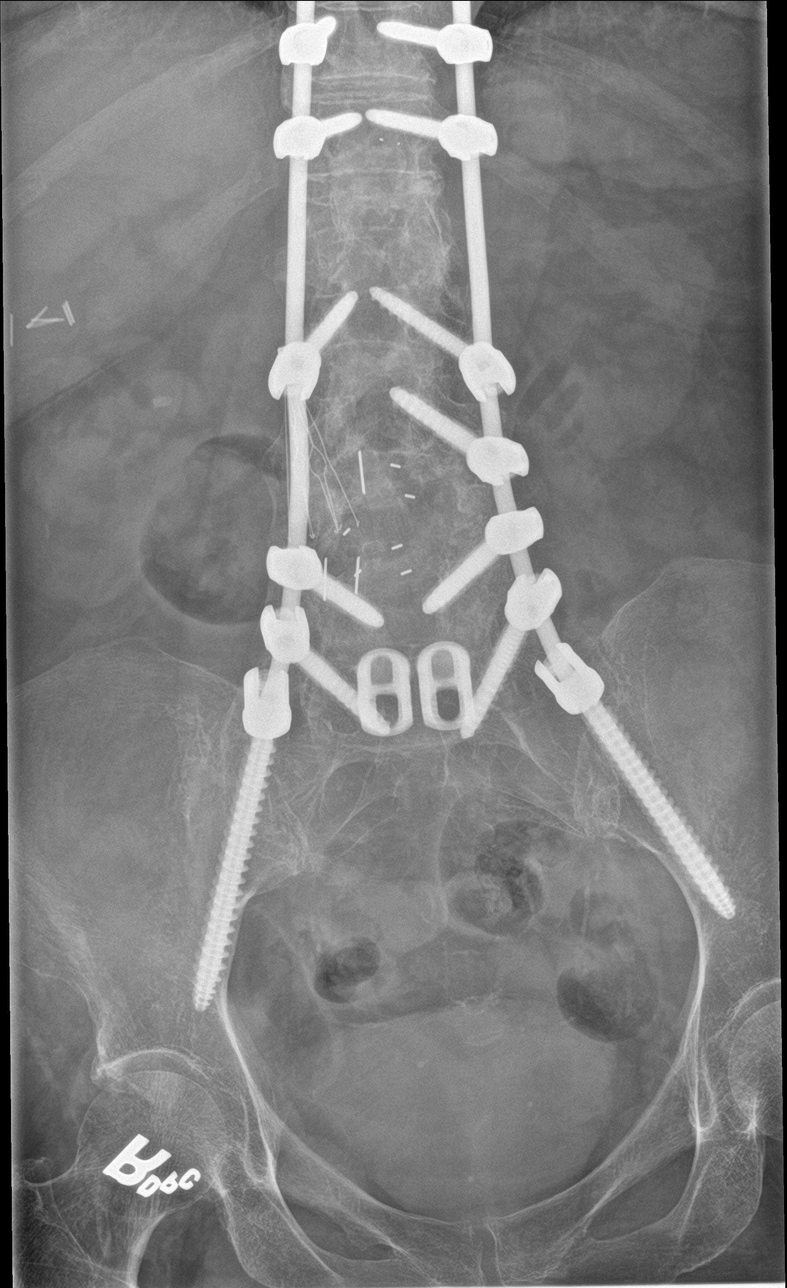

[l-spine obl (1 of 2)]
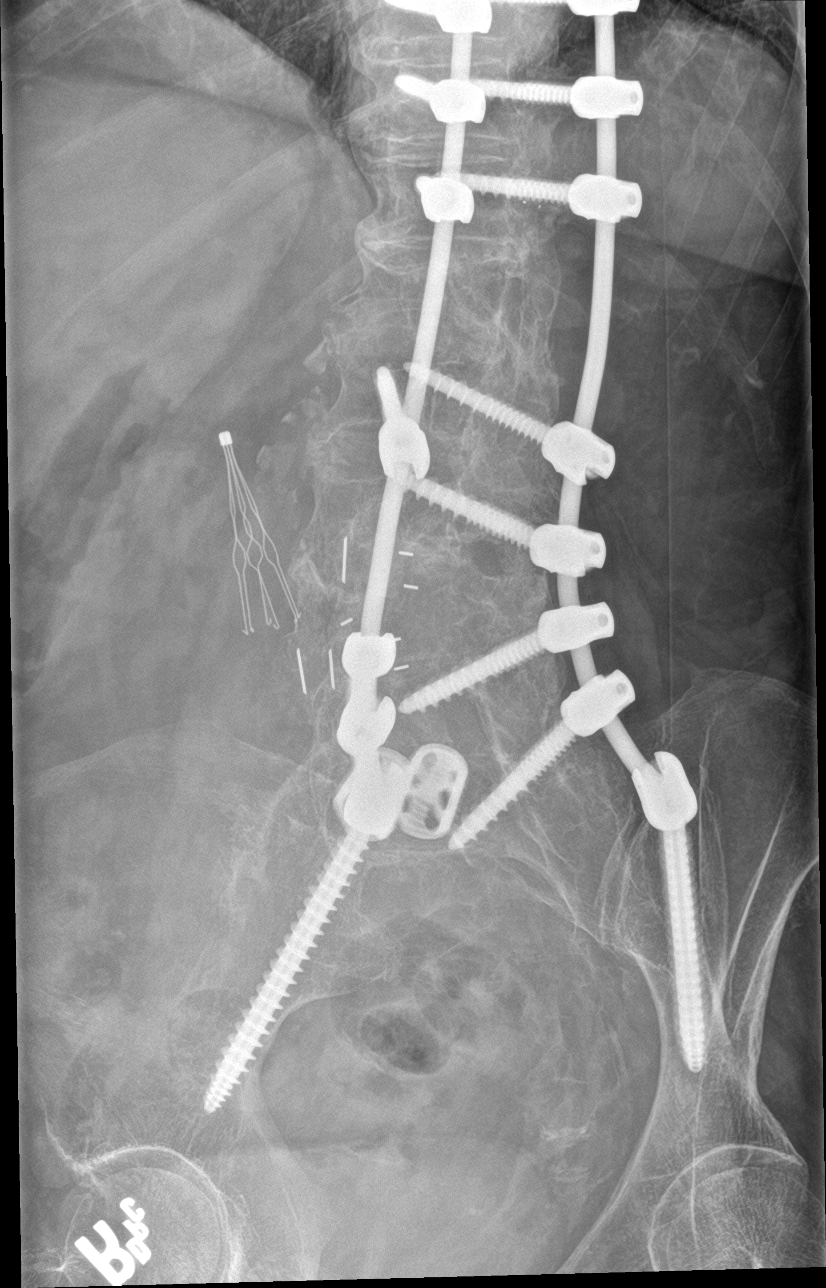

[l-spine obl (2 of 2)]
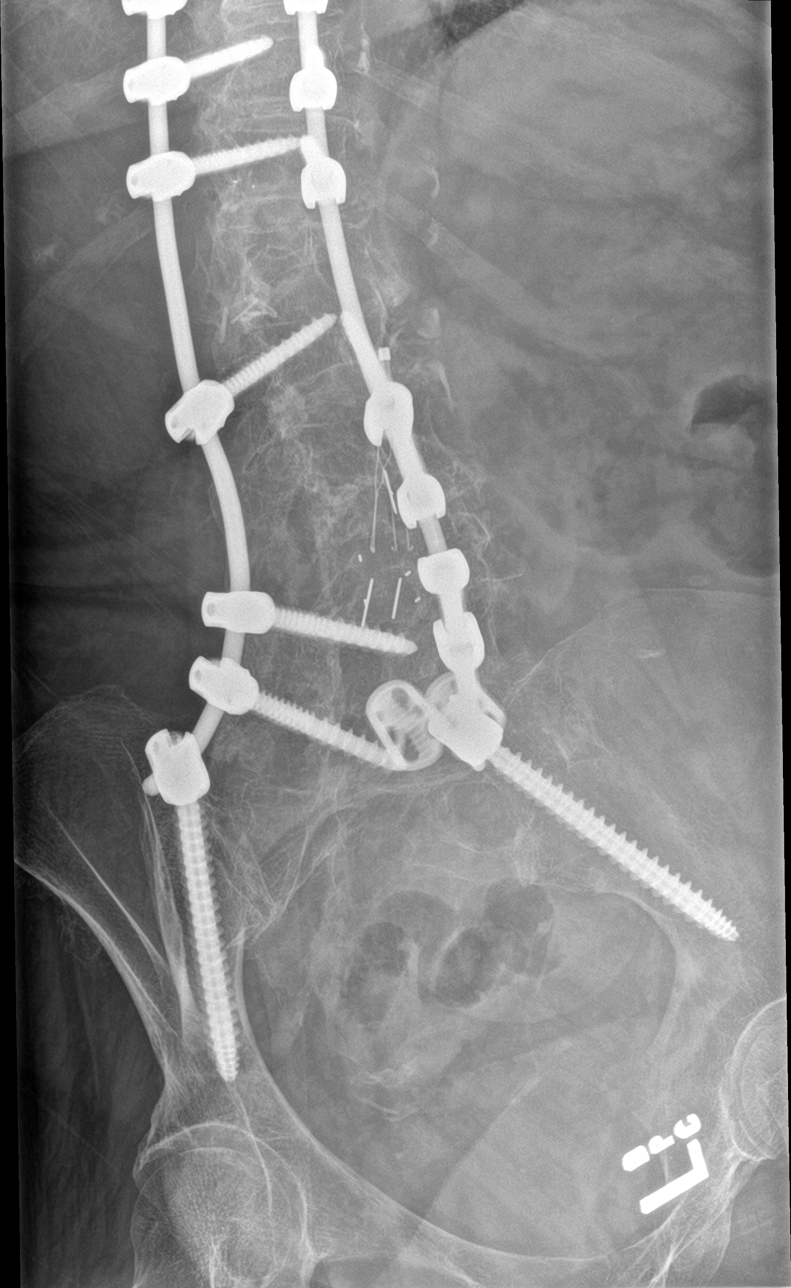

[l-spine lat]
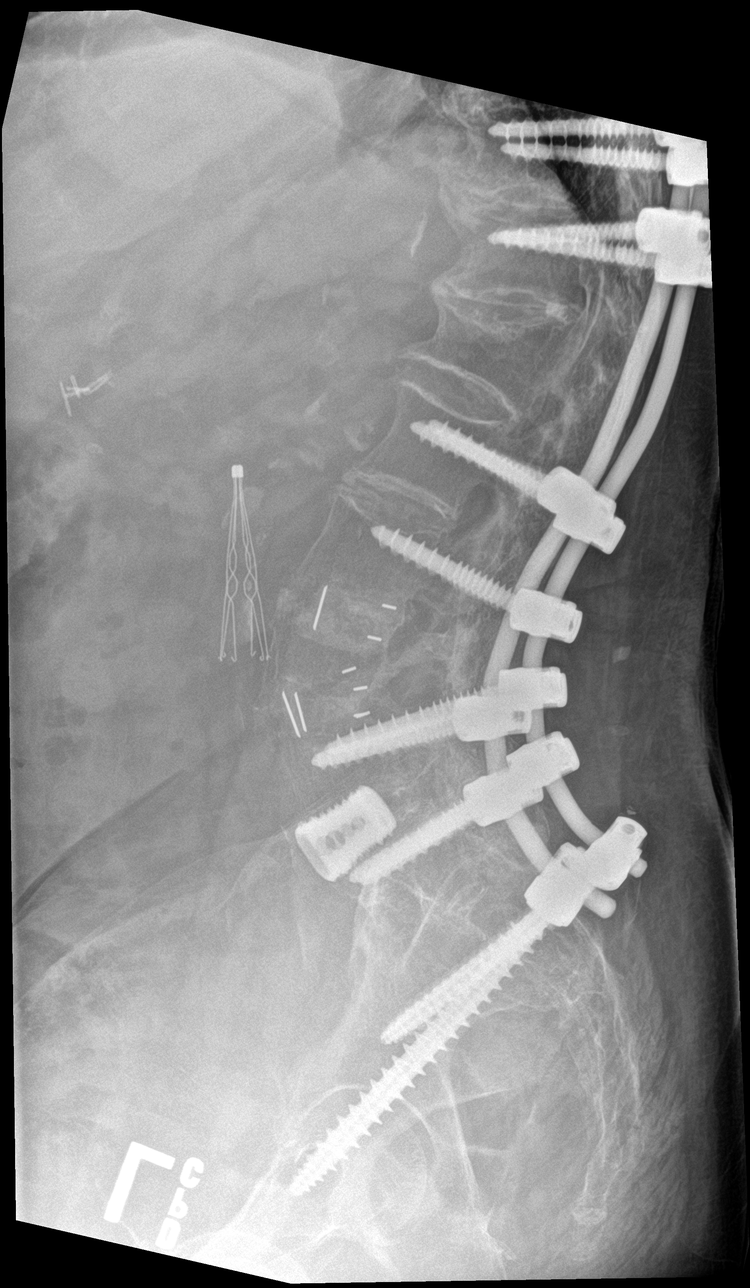

[l-spine spot]
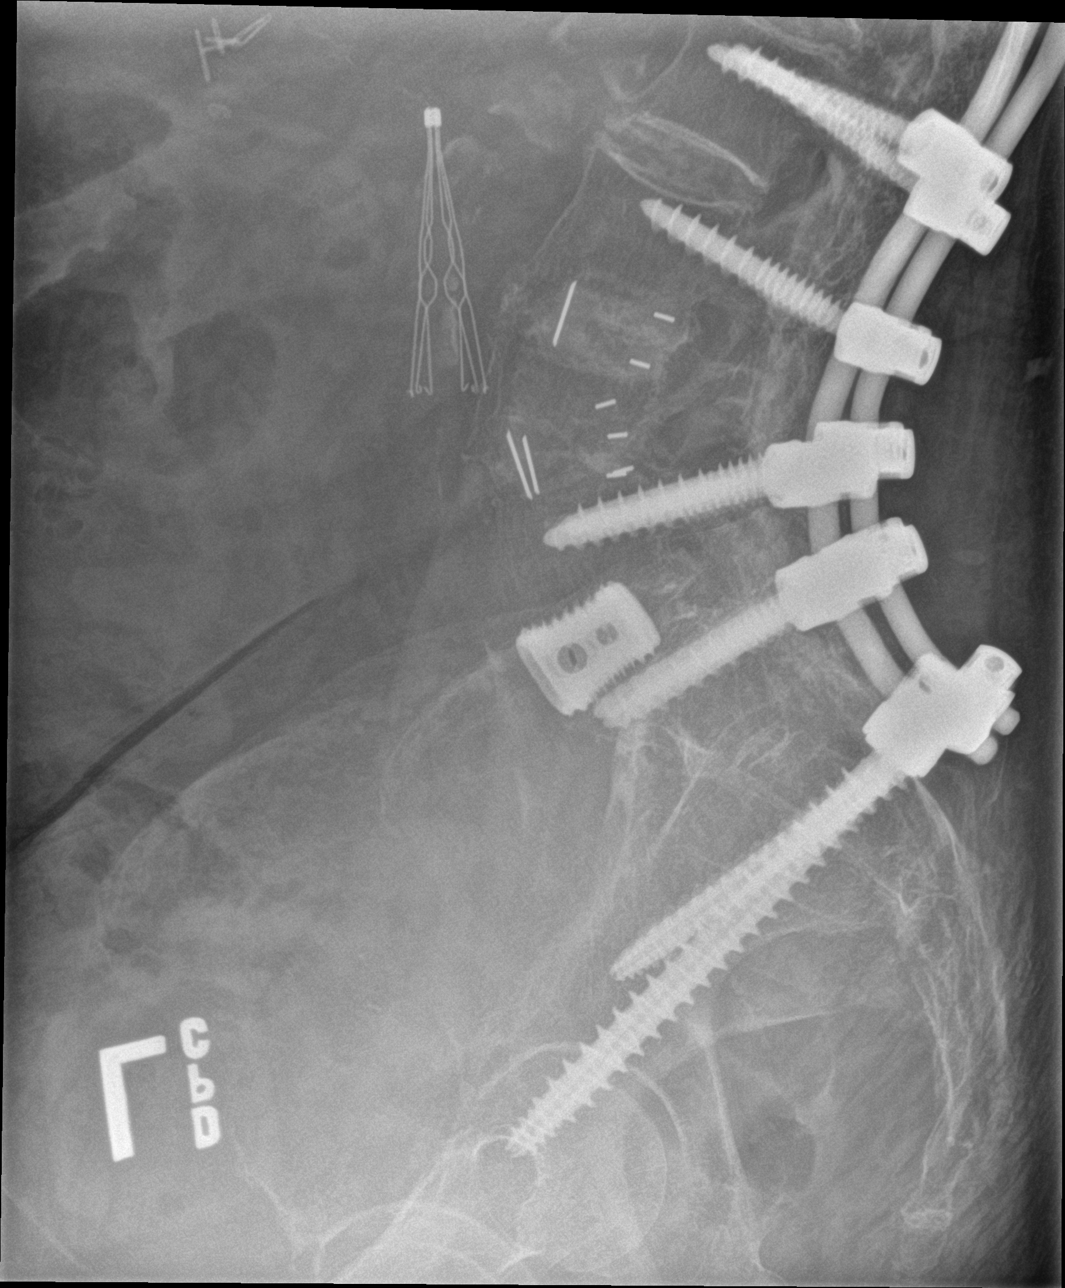

[5 of 5 positions shown; findings below may reference images not displayed]

FINDINGS: Lumbosacral fixation hardware.

Mild superior endplate compression fracture deformity at L1,
chronic.

No evidence of acute fracture or dislocation.

Visualized bony pelvis appears intact.

Cholecystectomy clips.  IVC filter.
IMPRESSION: Mild superior endplate compression fracture deformity at L1,
chronic.

No evidence of acute fracture or dislocation.

## 2021-12-19 IMAGING — DX DG SHOULDER 2+V*L*
2 series · 2 of 2 positions shown · non-contrast
Comparison: None.

CLINICAL DATA: Fall, pain

EXAM:
LEFT SHOULDER - 2+ VIEW

[shoulder grashey]
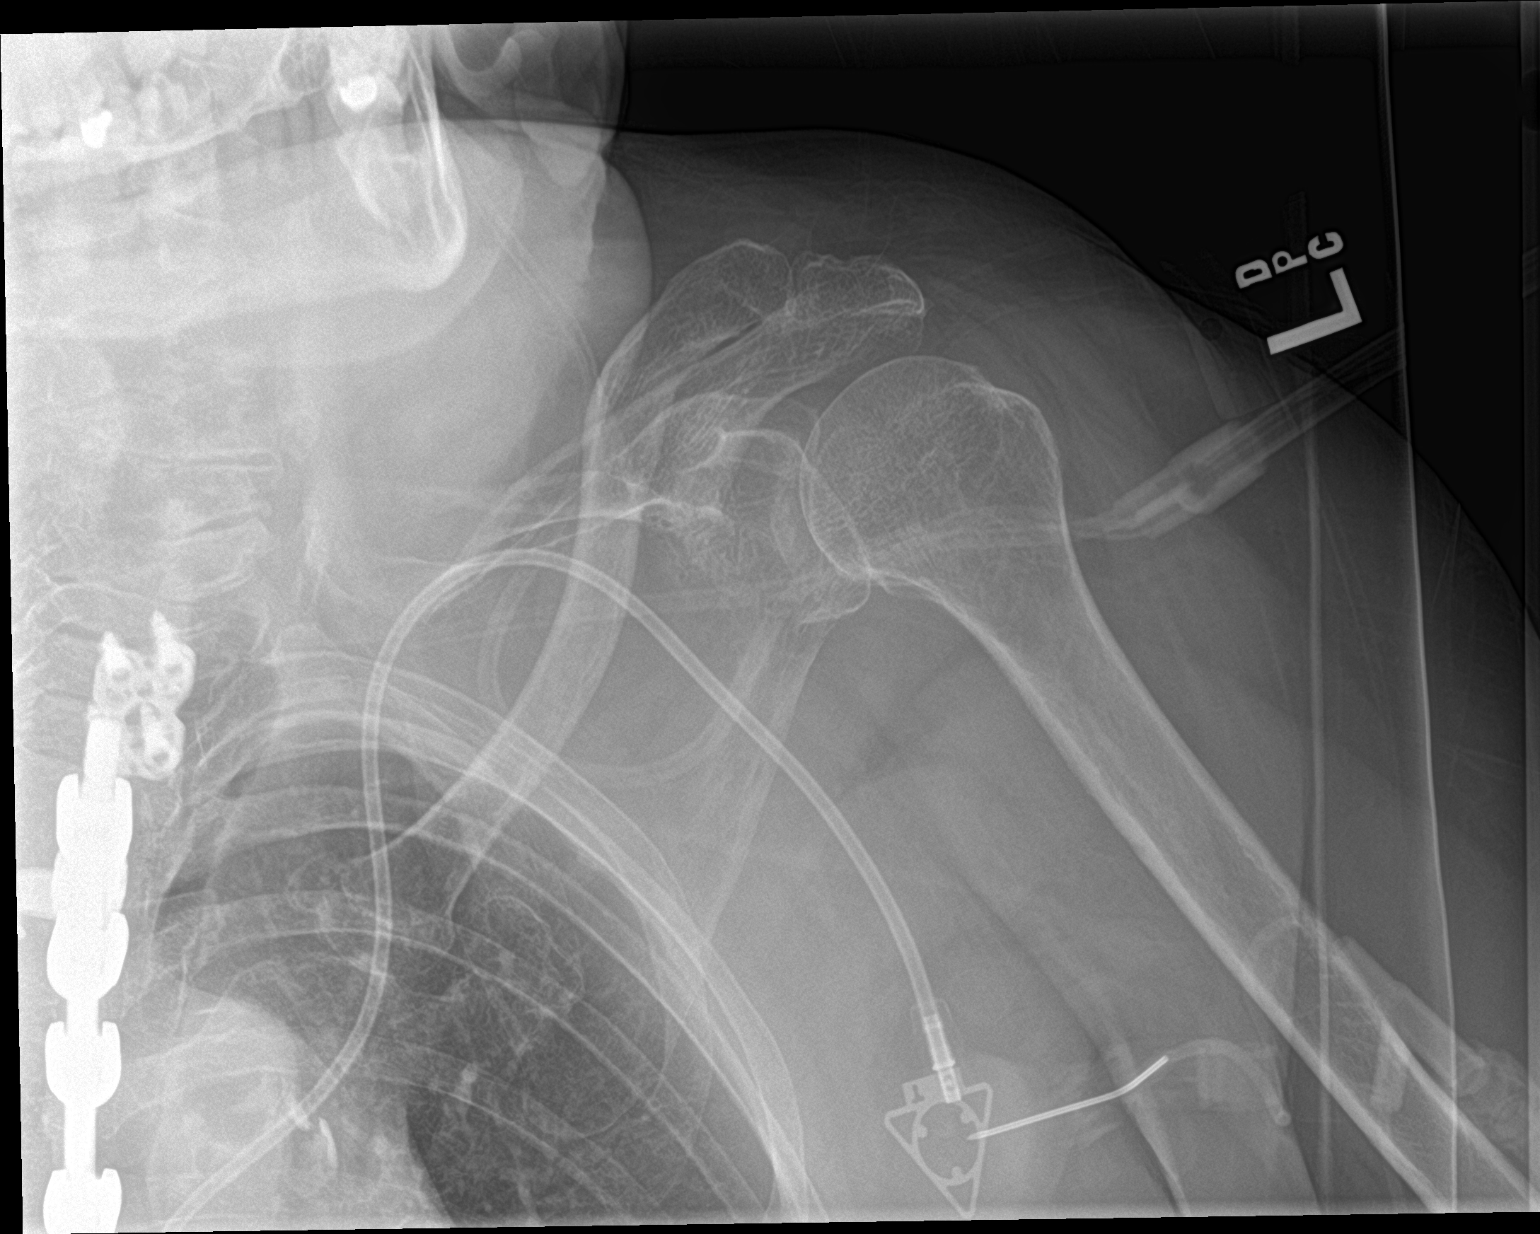

[shoulder y view]
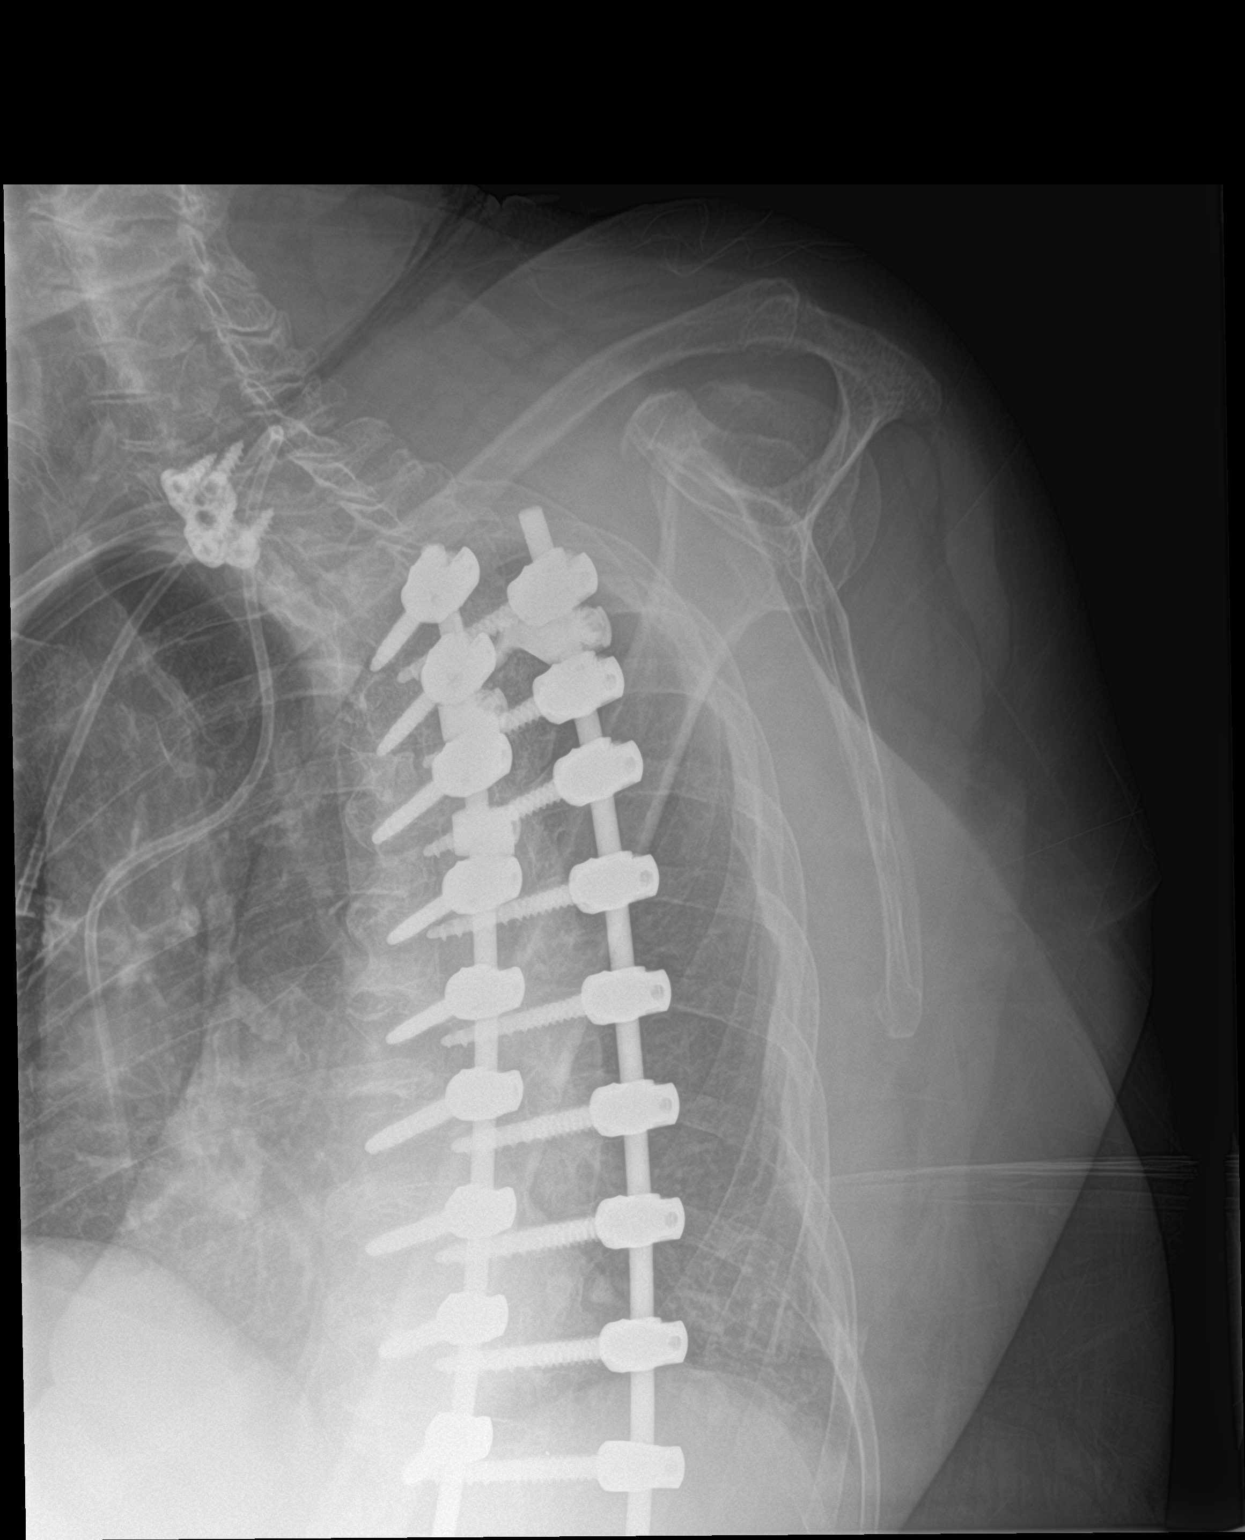

[2 of 2 positions shown; findings below may reference images not displayed]

FINDINGS: No fracture or dislocation is seen.

The joint spaces are preserved.

Visualized soft tissues are within normal limits.

Visualized left lung is clear.

Cervical and thoracic spinal fixation hardware, incompletely
visualized. Left chest power port, incompletely visualized.
IMPRESSION: Negative.
